# Patient Record
Sex: Female | Born: 1980 | Race: White | Marital: Married | Smoking: Never smoker
Health system: Southern US, Community
[De-identification: ages and names within clinical notes are randomized; demographics above are authoritative.]

## PROBLEM LIST (undated history)

## (undated) HISTORY — PX: WISDOM TOOTH EXTRACTION: SHX21

---

## 2017-06-03 ENCOUNTER — Ambulatory Visit: Payer: Self-pay | Admitting: Family Medicine

## 2018-01-28 ENCOUNTER — Other Ambulatory Visit: Payer: Self-pay | Admitting: Orthopedic Surgery

## 2018-01-28 DIAGNOSIS — S60222A Contusion of left hand, initial encounter: Secondary | ICD-10-CM

## 2018-02-08 ENCOUNTER — Ambulatory Visit
Admission: RE | Admit: 2018-02-08 | Discharge: 2018-02-08 | Disposition: A | Discharge status: Home / Self Care | Payer: Managed Care, Other (non HMO) | Source: Ambulatory Visit | Attending: Orthopedic Surgery | Admitting: Orthopedic Surgery

## 2018-02-08 DIAGNOSIS — S60222A Contusion of left hand, initial encounter: Secondary | ICD-10-CM

## 2018-02-08 MED ORDER — IOPAMIDOL (ISOVUE-M 200) INJECTION 41%
1.0000 mL | Freq: Once | INTRAMUSCULAR | Status: AC
  Administered 2018-02-08: 1 mL via INTRA_ARTICULAR

## 2018-02-16 ENCOUNTER — Other Ambulatory Visit: Payer: Self-pay | Admitting: Orthopedic Surgery

## 2018-02-22 ENCOUNTER — Other Ambulatory Visit: Payer: Self-pay

## 2018-02-22 ENCOUNTER — Encounter (HOSPITAL_BASED_OUTPATIENT_CLINIC_OR_DEPARTMENT_OTHER): Payer: Self-pay | Admitting: *Deleted

## 2018-03-01 ENCOUNTER — Ambulatory Visit (HOSPITAL_BASED_OUTPATIENT_CLINIC_OR_DEPARTMENT_OTHER)
Admission: RE | Admit: 2018-03-01 | Discharge: 2018-03-01 | Disposition: A | Discharge status: Home / Self Care | Payer: Managed Care, Other (non HMO) | Source: Ambulatory Visit | Attending: Orthopedic Surgery | Admitting: Orthopedic Surgery

## 2018-03-01 ENCOUNTER — Ambulatory Visit (HOSPITAL_BASED_OUTPATIENT_CLINIC_OR_DEPARTMENT_OTHER): Payer: Managed Care, Other (non HMO) | Admitting: Anesthesiology

## 2018-03-01 ENCOUNTER — Encounter (HOSPITAL_BASED_OUTPATIENT_CLINIC_OR_DEPARTMENT_OTHER)
Admission: RE | Disposition: A | Discharge status: Home / Self Care | Payer: Self-pay | Source: Ambulatory Visit | Attending: Orthopedic Surgery

## 2018-03-01 ENCOUNTER — Encounter (HOSPITAL_BASED_OUTPATIENT_CLINIC_OR_DEPARTMENT_OTHER): Payer: Self-pay | Admitting: Certified Registered"

## 2018-03-01 ENCOUNTER — Other Ambulatory Visit: Payer: Self-pay

## 2018-03-01 DIAGNOSIS — W010XXA Fall on same level from slipping, tripping and stumbling without subsequent striking against object, initial encounter: Secondary | ICD-10-CM | POA: Diagnosis not present

## 2018-03-01 DIAGNOSIS — S63418A Traumatic rupture of collateral ligament of other finger at metacarpophalangeal and interphalangeal joint, initial encounter: Secondary | ICD-10-CM | POA: Diagnosis present

## 2018-03-01 DIAGNOSIS — Y93K1 Activity, walking an animal: Secondary | ICD-10-CM | POA: Insufficient documentation

## 2018-03-01 DIAGNOSIS — Y9289 Other specified places as the place of occurrence of the external cause: Secondary | ICD-10-CM | POA: Insufficient documentation

## 2018-03-01 HISTORY — PX: ULNAR COLLATERAL LIGAMENT REPAIR: SHX6159

## 2018-03-01 LAB — POCT PREGNANCY, URINE: Preg Test, Ur: NEGATIVE

## 2018-03-01 SURGERY — REPAIR, LIGAMENT, ULNAR COLLATERAL
Anesthesia: Monitor Anesthesia Care | Site: Thumb | Laterality: Left

## 2018-03-01 MED ORDER — CEFAZOLIN SODIUM-DEXTROSE 2-4 GM/100ML-% IV SOLN
2.0000 g | INTRAVENOUS | Status: AC
  Administered 2018-03-01: 2 g via INTRAVENOUS

## 2018-03-01 MED ORDER — LACTATED RINGERS IV SOLN
INTRAVENOUS | Status: DC
  Administered 2018-03-01: 12:00:00 via INTRAVENOUS

## 2018-03-01 MED ORDER — CEFAZOLIN SODIUM-DEXTROSE 2-4 GM/100ML-% IV SOLN
INTRAVENOUS | Status: AC
  Filled 2018-03-01: qty 100

## 2018-03-01 MED ORDER — MIDAZOLAM HCL 2 MG/2ML IJ SOLN
INTRAMUSCULAR | Status: AC
  Filled 2018-03-01: qty 2

## 2018-03-01 MED ORDER — PROPOFOL 500 MG/50ML IV EMUL
INTRAVENOUS | Status: DC | PRN
  Administered 2018-03-01: 100 ug/kg/min via INTRAVENOUS

## 2018-03-01 MED ORDER — BUPIVACAINE-EPINEPHRINE (PF) 0.5% -1:200000 IJ SOLN
INTRAMUSCULAR | Status: DC | PRN
  Administered 2018-03-01: 30 mL via PERINEURAL

## 2018-03-01 MED ORDER — HYDROCODONE-ACETAMINOPHEN 5-325 MG PO TABS: 1.0000 | ORAL_TABLET | Freq: Four times a day (QID) | ORAL | 0 refills | Status: AC | PRN

## 2018-03-01 MED ORDER — ONDANSETRON HCL 4 MG/2ML IJ SOLN
INTRAMUSCULAR | Status: DC | PRN
  Administered 2018-03-01: 4 mg via INTRAVENOUS

## 2018-03-01 MED ORDER — FENTANYL CITRATE (PF) 100 MCG/2ML IJ SOLN
INTRAMUSCULAR | Status: AC
  Filled 2018-03-01: qty 2

## 2018-03-01 MED ORDER — SCOPOLAMINE 1 MG/3DAYS TD PT72: 1.0000 | MEDICATED_PATCH | Freq: Once | TRANSDERMAL | Status: DC | PRN

## 2018-03-01 MED ORDER — LIDOCAINE HCL (CARDIAC) PF 100 MG/5ML IV SOSY
PREFILLED_SYRINGE | INTRAVENOUS | Status: DC | PRN
  Administered 2018-03-01: 30 mg via INTRAVENOUS

## 2018-03-01 MED ORDER — ONDANSETRON HCL 4 MG/2ML IJ SOLN: 4.0000 mg | Freq: Four times a day (QID) | INTRAMUSCULAR | Status: DC | PRN

## 2018-03-01 MED ORDER — FENTANYL CITRATE (PF) 100 MCG/2ML IJ SOLN
50.0000 ug | INTRAMUSCULAR | Status: DC | PRN
  Administered 2018-03-01: 100 ug via INTRAVENOUS

## 2018-03-01 MED ORDER — OXYCODONE HCL 5 MG/5ML PO SOLN: 5.0000 mg | Freq: Once | ORAL | Status: DC | PRN

## 2018-03-01 MED ORDER — FENTANYL CITRATE (PF) 100 MCG/2ML IJ SOLN: 25.0000 ug | INTRAMUSCULAR | Status: DC | PRN

## 2018-03-01 MED ORDER — MIDAZOLAM HCL 2 MG/2ML IJ SOLN
1.0000 mg | INTRAMUSCULAR | Status: DC | PRN
  Administered 2018-03-01: 2 mg via INTRAVENOUS

## 2018-03-01 MED ORDER — OXYCODONE HCL 5 MG PO TABS: 5.0000 mg | ORAL_TABLET | Freq: Once | ORAL | Status: DC | PRN

## 2018-03-01 SURGICAL SUPPLY — 61 items
ANCHOR JUGGERKNOT 1.0 1DR 2-0 (Anchor) ×3 IMPLANT
BAG DECANTER FOR FLEXI CONT (MISCELLANEOUS) IMPLANT
BLADE MINI RND TIP GREEN BEAV (BLADE) IMPLANT
BLADE SURG 15 STRL LF DISP TIS (BLADE) ×1 IMPLANT
BLADE SURG 15 STRL SS (BLADE) ×2
BNDG COHESIVE 3X5 TAN STRL LF (GAUZE/BANDAGES/DRESSINGS) ×3 IMPLANT
BNDG ESMARK 4X9 LF (GAUZE/BANDAGES/DRESSINGS) IMPLANT
BNDG GAUZE ELAST 4 BULKY (GAUZE/BANDAGES/DRESSINGS) ×3 IMPLANT
CHLORAPREP W/TINT 26ML (MISCELLANEOUS) ×3 IMPLANT
CORD BIPOLAR FORCEPS 12FT (ELECTRODE) ×3 IMPLANT
COVER BACK TABLE 60X90IN (DRAPES) ×3 IMPLANT
COVER MAYO STAND STRL (DRAPES) ×3 IMPLANT
CUFF TOURNIQUET SINGLE 18IN (TOURNIQUET CUFF) IMPLANT
DECANTER SPIKE VIAL GLASS SM (MISCELLANEOUS) ×3 IMPLANT
DRAPE EXTREMITY T 121X128X90 (DRAPE) ×3 IMPLANT
DRAPE OEC MINIVIEW 54X84 (DRAPES) IMPLANT
DRAPE SURG 17X23 STRL (DRAPES) ×3 IMPLANT
GAUZE SPONGE 4X4 12PLY STRL (GAUZE/BANDAGES/DRESSINGS) ×3 IMPLANT
GAUZE XEROFORM 1X8 LF (GAUZE/BANDAGES/DRESSINGS) ×3 IMPLANT
GLOVE BIOGEL PI IND STRL 8.5 (GLOVE) ×1 IMPLANT
GLOVE BIOGEL PI INDICATOR 8.5 (GLOVE) ×2
GLOVE SURG ORTHO 8.0 STRL STRW (GLOVE) ×3 IMPLANT
GOWN STRL REUS W/ TWL LRG LVL3 (GOWN DISPOSABLE) ×1 IMPLANT
GOWN STRL REUS W/TWL LRG LVL3 (GOWN DISPOSABLE) ×2
GOWN STRL REUS W/TWL XL LVL3 (GOWN DISPOSABLE) ×3 IMPLANT
K-WIRE .035X4 (WIRE) IMPLANT
NDL SAFETY ECLIPSE 18X1.5 (NEEDLE) IMPLANT
NEEDLE FISTULA 1/2 CIRCLE (NEEDLE) IMPLANT
NEEDLE HYPO 18GX1.5 SHARP (NEEDLE)
NEEDLE KEITH (NEEDLE) IMPLANT
NEEDLE KEITH SZ10 STRAIGHT (NEEDLE) IMPLANT
NEEDLE PRECISIONGLIDE 27X1.5 (NEEDLE) IMPLANT
NS IRRIG 1000ML POUR BTL (IV SOLUTION) ×3 IMPLANT
PACK BASIN DAY SURGERY FS (CUSTOM PROCEDURE TRAY) ×3 IMPLANT
PAD CAST 3X4 CTTN HI CHSV (CAST SUPPLIES) ×1 IMPLANT
PAD CAST 4YDX4 CTTN HI CHSV (CAST SUPPLIES) IMPLANT
PADDING CAST ABS 4INX4YD NS (CAST SUPPLIES) ×2
PADDING CAST ABS COTTON 4X4 ST (CAST SUPPLIES) ×1 IMPLANT
PADDING CAST COTTON 3X4 STRL (CAST SUPPLIES) ×2
PADDING CAST COTTON 4X4 STRL (CAST SUPPLIES)
PASSER SUT SWANSON 36MM LOOP (INSTRUMENTS) IMPLANT
SLEEVE SCD COMPRESS KNEE MED (MISCELLANEOUS) IMPLANT
SPLINT PLASTER CAST XFAST 3X15 (CAST SUPPLIES) IMPLANT
SPLINT PLASTER XTRA FASTSET 3X (CAST SUPPLIES)
STOCKINETTE 4X48 STRL (DRAPES) ×3 IMPLANT
SUT ETHIBOND 3-0 V-5 (SUTURE) IMPLANT
SUT ETHILON 4 0 PS 2 18 (SUTURE) ×3 IMPLANT
SUT FIBERWIRE 2-0 18 17.9 3/8 (SUTURE)
SUT FIBERWIRE 3-0 18 TAPR NDL (SUTURE)
SUT MERSILENE 2.0 SH NDLE (SUTURE) IMPLANT
SUT MERSILENE 4 0 P 3 (SUTURE) IMPLANT
SUT SILK 4 0 PS 2 (SUTURE) IMPLANT
SUT STEEL 3 0 (SUTURE) IMPLANT
SUT STEEL 4 0 (SUTURE) IMPLANT
SUT VICRYL 4-0 PS2 18IN ABS (SUTURE) IMPLANT
SUTURE FIBERWR 2-0 18 17.9 3/8 (SUTURE) IMPLANT
SUTURE FIBERWR 3-0 18 TAPR NDL (SUTURE) IMPLANT
SYR BULB 3OZ (MISCELLANEOUS) ×3 IMPLANT
SYR CONTROL 10ML LL (SYRINGE) IMPLANT
TOWEL GREEN STERILE FF (TOWEL DISPOSABLE) ×6 IMPLANT
UNDERPAD 30X30 (UNDERPADS AND DIAPERS) ×3 IMPLANT

## 2018-03-01 NOTE — Anesthesia Procedure Notes (Signed)
Anesthesia Regional Block: Supraclavicular block   Pre-Anesthetic Checklist: ,, timeout performed, Correct Patient, Correct Site, Correct Laterality, Correct Procedure, Correct Position, site marked, Risks and benefits discussed,  Surgical consent,  Pre-op evaluation,  At surgeon's request and post-op pain management  Laterality: Left  Prep: chloraprep       Needles:  Injection technique: Single-shot  Needle Type: Echogenic Stimulator Needle     Needle Length: 5cm  Needle Gauge: 22     Additional Needles:   Procedures:, nerve stimulator,,,,,,,   Nerve Stimulator or Paresthesia:  Response: biceps flexion, 0.45 mA,   Additional Responses:   Narrative:  Start time: 03/01/2018 12:40 PM End time: 03/01/2018 12:48 PM Injection made incrementally with aspirations every 5 mL.  Performed by: Personally  Anesthesiologist: Achille RichHodierne, Kiet Geer, MD  Additional Notes: Functioning IV was confirmed and monitors were applied.  A 50mm 22ga Arrow echogenic stimulator needle was used. Sterile prep and drape,hand hygiene and sterile gloves were used.  Negative aspiration and negative test dose prior to incremental administration of local anesthetic. The patient tolerated the procedure well.  Ultrasound guidance: relevent anatomy identified, needle position confirmed, local anesthetic spread visualized around nerve(s), vascular puncture avoided.  Image printed for medical record.

## 2018-03-01 NOTE — Anesthesia Preprocedure Evaluation (Signed)
Anesthesia Evaluation  Patient identified by MRN, date of birth, ID band Patient awake    Reviewed: Allergy & Precautions, H&P , NPO status , Patient's Chart, lab work & pertinent test results  Airway Mallampati: II   Neck ROM: full    Dental   Pulmonary neg pulmonary ROS,    breath sounds clear to auscultation       Cardiovascular negative cardio ROS   Rhythm:regular Rate:Normal     Neuro/Psych    GI/Hepatic   Endo/Other    Renal/GU      Musculoskeletal   Abdominal   Peds  Hematology   Anesthesia Other Findings   Reproductive/Obstetrics                             Anesthesia Physical Anesthesia Plan  ASA: I  Anesthesia Plan: MAC and Regional   Post-op Pain Management:    Induction: Intravenous  PONV Risk Score and Plan: 2 and Ondansetron, Propofol infusion, Dexamethasone, Midazolam and Treatment may vary due to age or medical condition  Airway Management Planned: Simple Face Mask  Additional Equipment:   Intra-op Plan:   Post-operative Plan:   Informed Consent: I have reviewed the patients History and Physical, chart, labs and discussed the procedure including the risks, benefits and alternatives for the proposed anesthesia with the patient or authorized representative who has indicated his/her understanding and acceptance.     Plan Discussed with: CRNA, Anesthesiologist and Surgeon  Anesthesia Plan Comments:         Anesthesia Quick Evaluation

## 2018-03-01 NOTE — Anesthesia Procedure Notes (Signed)
Procedure Name: MAC Date/Time: 03/01/2018 1:37 PM Performed by: Signe Colt, CRNA Pre-anesthesia Checklist: Patient identified, Emergency Drugs available, Suction available, Patient being monitored and Timeout performed Patient Re-evaluated:Patient Re-evaluated prior to induction Oxygen Delivery Method: Simple face mask

## 2018-03-01 NOTE — Discharge Instructions (Addendum)
[facility] Instructions °Hand Surgery ° °Wound Care: °Keep your hand elevated above the level of your heart.  Do not allow it to dangle by your side.  Keep the dressing dry and do not remove it unless your doctor advises you to do so.  He will usually change it at the time of your post-op visit.  Moving your fingers is advised to stimulate circulation but will depend on the site of your surgery.  If you have a splint applied, your doctor will advise you regarding movement. ° °Activity: °Do not drive or operate machinery today.  Rest today and then you may return to your normal activity and work as indicated by your physician. ° °Diet:  °Drink liquids today or eat a light diet.  You may resume a regular diet tomorrow.   ° ° °Post Anesthesia Home Care Instructions ° °Activity: °Get plenty of rest for the remainder of the day. A responsible individual must stay with you for 24 hours following the procedure.  °For the next 24 hours, DO NOT: °-Drive a car °-Operate machinery °-Drink alcoholic beverages °-Take any medication unless instructed by your physician °-Make any legal decisions or sign important papers. ° °Meals: °Start with liquid foods such as gelatin or soup. Progress to regular foods as tolerated. Avoid greasy, spicy, heavy foods. If nausea and/or vomiting occur, drink only clear liquids until the nausea and/or vomiting subsides. Call your physician if vomiting continues. ° °Special Instructions/Symptoms: °Your throat may feel dry or sore from the anesthesia or the breathing tube placed in your throat during surgery. If this causes discomfort, gargle with warm salt water. The discomfort should disappear within 24 hours. ° °If you had a scopolamine patch placed behind your ear for the management of post- operative nausea and/or vomiting: ° °1. The medication in the patch is effective for 72 hours, after which it should be removed.  Wrap patch in a tissue and discard in the trash. Wash hands thoroughly with  soap and water. °2. You may remove the patch earlier than 72 hours if you experience unpleasant side effects which may include dry mouth, dizziness or visual disturbances. °3. Avoid touching the patch. Wash your hands with soap and water after contact with the patch. °    ° °General expectations: °Pain for two to three days. °Fingers may become slightly swollen. ° °Call your doctor if any of the following occur: °Severe pain not relieved by pain medication. °Elevated temperature. °Dressing soaked with blood. °Inability to move fingers. °White or bluish color to fingers.  °

## 2018-03-01 NOTE — Progress Notes (Signed)
Assisted Dr. Hodierne with left, ultrasound guided, supraclavicular block. Side rails up, monitors on throughout procedure. See vital signs in flow sheet. Tolerated Procedure well. 

## 2018-03-01 NOTE — H&P (Signed)
Penny BlonderHeather Ortega is an 37 y.o. female.   Chief Complaint:l eft  thumb pain OZH:[id number]:Penny Ortega is a 37 year old right-hand-dominant female who sustained an injury to her left thumb [place] 2 months ago. She was walking her dog with a leash the dog bolted and she jammed her thumb into a bed she does not remember the exact mechanism of injury she complains of pain on the volar radial aspect of the metacarpal phalangeal joint. She was seen at an urgent care there. They felt she had a sprain placed her in a thumb spica splint and referred her for continued care she has no prior history of injury. She complains of a sharp pain with a VAS score 3-4 over 10 if she uses her thumb. She has taken ibuprofen only for discomfort. She states the splint has been adequate but is large.She was referred for arthrographic MRI of her metacarpal phalangeal joint left thumb.She has had her MRI done revealing a tear of the ulnar collateral ligament metacarpal phalangeal joint left thumb. This is read by Dr. Allena KatzPatel. There is probably partial healing. Collateral ligament appears to lie in reasonable position along the side of the thumb middle phalanx but no healing. She has no history of diabetes thyroid problems arthritis or gout. Family history is negative for each of these also appear     History reviewed. No pertinent past medical history.  Past Surgical History:  Procedure Laterality Date  . WISDOM TOOTH EXTRACTION      History reviewed. No pertinent family history. Social History:  reports that she has never smoked. She has never used smokeless tobacco. She reports that she drinks alcohol. She reports that she does not use drugs.  Allergies: No Known Allergies  No medications prior to admission.    No results found for this or any previous visit (from the past 48 hour(s)).  No results found.   Pertinent items are noted in HPI.  Height 5\' 5"  (1.651 m),  weight 65.8 kg (145 lb), last menstrual period 02/07/2018.  General appearance: alert, cooperative and appears stated age Head: Normocephalic, without obvious abnormality Neck: no JVD Resp: clear to auscultation bilaterally Cardio: regular rate and rhythm, S1, S2 normal, no murmur, click, rub or gallop GI: soft, non-tender; bowel sounds normal; no masses,  no organomegaly Extremities: pain MCP left thumb Pulses: 2+ and symmetric Skin: Skin color, texture, turgor normal. No rashes or lesions Neurologic: Grossly normal Incision/Wound: na  Assessment/Plan Assessment:  1. Gamekeeper's thumb of left hand   Plan: We have discussed with her the possibility of further conservative treatment versus open repair. She states that she is tired of this and she does not want to consider new conservative treatment. She would like to have this operated on. Pre-peri-and postoperative course are discussed along with risks and complications. She is aware there is no guarantee that surgery the possibility of infection recurrence injury to arteries nerves tendons complete relief of symptoms dystrophy. She is scheduled for repair reconstruction ulnar collateral ligament metacarpal phalangeal joint left thumb with possible APL graft as an outpatient under regional anesthesia.      Keita Demarco R 03/01/2018, 10:43 AM

## 2018-03-01 NOTE — Op Note (Signed)
NAME: Penny Ortega MEDICAL RECORD NO: [id number] DATE OF BIRTH: 10/01/1980 [facility] LOCATION: [facility] PHYSICIAN: Nicki ReaperGARY R. Aicha Clingenpeel, MD   OPERATIVE REPORT   DATE OF PROCEDURE: 03/01/18    PREOPERATIVE DIAGNOSIS:   Gamekeeper's thumb left thumb   POSTOPERATIVE DIAGNOSIS:   Same   PROCEDURE:   Repair ulnar collateral ligament metacarpal phalangeal joint left thumb   SURGEON: Cindee SaltGary Esaiah Wanless, M.D.   ASSISTANT: Myrtice LauthK Dung Prien,MD   ANESTHESIA:  Regional with sedation   INTRAVENOUS FLUIDS:  Per anesthesia flow sheet.   ESTIMATED BLOOD LOSS:  Minimal.   COMPLICATIONS:  None.   SPECIMENS:  none   TOURNIQUET TIME:    Total Tourniquet Time Documented: Upper Arm (Left) - 31 minutes Total: Upper Arm (Left) - 31 minutes    DISPOSITION:  Stable to PACU.   INDICATIONS: Patient is a 37 year old female who sustained an injury to her left thumb and a fall.  She does not recall the exact injury.  This approximately 2-1/2 months old.  She has undergone conservative treatment and continues to have pain.  She has had an MRI done revealing partial healing of a ulnar collateral ligament metacarpal phalangeal joint of the left thumb with extrusion of the contrast agent through the unhealed portion.  She is aware there that there is no guarantee to the surgery the possibility of infection recurrence injury to arteries and nerves tendons and complete relief symptoms dystrophy.  Pre-peri-and postoperative course been discussed along with risks and complications.  In the preoperative area the patient seen extremity marked by both patient and surgeon antibiotic given OPERATIVE COURSE: Patient is brought to the operating room following a supraclavicular block in the preoperative area under the direction the anesthesia department.  She was prepped and draped in supine position with a left arm free.  IV sedation was given a 3-minute dry time allowed and timeout taken for patient procedure.   The limb was exsanguinated with an Esmarch bandage turn placed on the arm was inflated to 250 mmHg.  A curvilinear incision was made over the metacarpal phalangeal joint of the left thumb carried down through subcutaneous tissue.  No dorsal sensory nerve was identified protected.  The abductor aponeurosis was then incised with sharp dissection removing the metacarpal phalangeal joint and incision was made on the dorsal capsule.  Blood immediately extruded.  The joint was open the collateral ligament was found to be partially healed but a large area was entirely unhealed to the bone.  The small portion which had healed was  released from the proximal phalanx.  The area was roughened and the area irrigated a drill hole was placed for a mini juggernaut.  It was confirmed with image intensification both AP and lateral direction revealing good position for repair of the collateral ligament.  The anchor was placed the collateral ligament was then repaired doubling it over distally with a suture to maintain it up against the bone.  This was quite stable.  The abductor aponeurosis was then repaired with a running 5-0 Mersilene suture.  The skin was closed with interrupted 5-0 nylon sutures.  A sterile compressive dressing dorsal palmar thumb spica splint was applied.  Inflation of the tourniquet all fingers immediately pink.  She was taken to the recovery room for observation in satisfactory condition.  She will be [place] home to [place] [place] 1 week on Norco.  She will try ibuprofen Tylenol to see if this is adequate if not she will have  the Norco for breakthrough.   Nicki Reaper, MD Electronically signed, 03/01/18

## 2018-03-01 NOTE — Op Note (Signed)
I assisted Surgeon(s) and Role:    * Cindee SaltKuzma, Gary, MD - Primary    * Betha LoaKuzma, Conlee Sliter, MD - Assisting on the Procedure(s): REPAIR RECONSTRUCTION ULNAR COLLATERAL LIGAMENT LEFT THUMB on 03/01/2018.  I provided assistance on this case as follows: retraction soft tissues, preparation site for anchor, positioning of finger for anchor placement.  Electronically signed by: Tami RibasKUZMA,Kerri Asche R, MD Date: 03/01/2018 Time: 7:29 PM

## 2018-03-01 NOTE — Transfer of Care (Signed)
Immediate Anesthesia Transfer of Care Note  Patient: Penny Ortega  Procedure(s) Performed: REPAIR RECONSTRUCTION ULNAR COLLATERAL LIGAMENT LEFT THUMB (Left Thumb)  Patient Location: PACU  Anesthesia Type:MAC combined with regional for post-op pain  Level of Consciousness: awake, alert , oriented and patient cooperative  Airway & Oxygen Therapy: Patient Spontanous Breathing and Patient connected to face mask oxygen  Post-op Assessment: Report given to RN and Post -op Vital signs reviewed and stable  Post vital signs: Reviewed and stable  Last Vitals:  Vitals Value Taken Time  BP    Temp    Pulse 55 03/01/2018  2:12 PM  Resp 13 03/01/2018  2:12 PM  SpO2 100 % 03/01/2018  2:12 PM  Vitals shown include unvalidated device data.  Last Pain:  Vitals:   03/01/18 1222  TempSrc: Oral         Complications: No apparent anesthesia complications

## 2018-03-01 NOTE — Brief Op Note (Signed)
03/01/2018  2:10 PM  PATIENT:  Penny Ortega  37 y.o. female  PRE-OPERATIVE DIAGNOSIS:  Game Keeper's Thumb Left  POST-OPERATIVE DIAGNOSIS:  Game Keeper's Thumb Left  PROCEDURE:  Procedure(s): REPAIR RECONSTRUCTION ULNAR COLLATERAL LIGAMENT LEFT THUMB (Left)  SURGEON:  Surgeon(s) and Role:    * Cindee SaltKuzma, Joram Venson, MD - Primary    * Betha LoaKuzma, Kevin, MD - Assisting  PHYSICIAN ASSISTANT:   ASSISTANTS: K Larson Limones,MD   ANESTHESIA:   regional and IV sedation  EBL:  1ml  BLOOD ADMINISTERED:none  DRAINS: none   LOCAL MEDICATIONS USED:  NONE  SPECIMEN:  No Specimen  DISPOSITION OF SPECIMEN:  N/A  COUNTS:  YES  TOURNIQUET:   Total Tourniquet Time Documented: Upper Arm (Left) - 31 minutes Total: Upper Arm (Left) - 31 minutes   DICTATION: .Reubin Milanragon Dictation  PLAN OF CARE: Discharge to home after PACU  PATIENT DISPOSITION:  PACU - hemodynamically stable.

## 2018-03-02 ENCOUNTER — Encounter (HOSPITAL_BASED_OUTPATIENT_CLINIC_OR_DEPARTMENT_OTHER): Payer: Self-pay | Admitting: Orthopedic Surgery

## 2018-03-02 NOTE — Anesthesia Postprocedure Evaluation (Signed)
Anesthesia Post Note  Patient: Penny Ortega  Procedure(s) Performed: REPAIR RECONSTRUCTION ULNAR COLLATERAL LIGAMENT LEFT THUMB (Left Thumb)     Patient location during evaluation: PACU Anesthesia Type: Regional and MAC Level of consciousness: awake and alert Pain management: pain level controlled Vital Signs Assessment: post-procedure vital signs reviewed and stable Respiratory status: spontaneous breathing, nonlabored ventilation, respiratory function stable and patient connected to nasal cannula oxygen Cardiovascular status: stable and blood pressure returned to baseline Postop Assessment: no apparent nausea or vomiting Anesthetic complications: no    Last Vitals:  Vitals:   03/01/18 1445 03/01/18 1458  BP: 108/74 117/80  Pulse: 60 (!) 54  Resp: 20 18  Temp:  36.6 C  SpO2: 98% 99%    Last Pain:  Vitals:   03/01/18 1458  TempSrc: Oral  PainSc: 0-No pain   Pain Goal:                 Taura Lamarre S

## 2018-08-31 ENCOUNTER — Other Ambulatory Visit: Payer: Self-pay | Admitting: Hand Surgery

## 2018-08-31 DIAGNOSIS — M79645 Pain in left finger(s): Secondary | ICD-10-CM

## 2018-09-07 ENCOUNTER — Ambulatory Visit
Admission: RE | Admit: 2018-09-07 | Discharge: 2018-09-07 | Disposition: A | Discharge status: Home / Self Care | Payer: Managed Care, Other (non HMO) | Source: Ambulatory Visit | Attending: Hand Surgery | Admitting: Hand Surgery

## 2018-09-07 DIAGNOSIS — M79645 Pain in left finger(s): Secondary | ICD-10-CM

## 2018-09-07 MED ORDER — GADOBENATE DIMEGLUMINE 529 MG/ML IV SOLN
14.0000 mL | Freq: Once | INTRAVENOUS | Status: AC | PRN
  Administered 2018-09-07: 14 mL via INTRAVENOUS

## 2018-09-18 ENCOUNTER — Emergency Department (HOSPITAL_COMMUNITY)
Admission: EM | Admit: 2018-09-18 | Discharge: 2018-09-18 | Discharge status: Against Medical Advice | Payer: Managed Care, Other (non HMO) | Attending: Emergency Medicine | Admitting: Emergency Medicine

## 2018-09-18 ENCOUNTER — Encounter (HOSPITAL_COMMUNITY): Payer: Self-pay | Admitting: *Deleted

## 2018-09-18 ENCOUNTER — Other Ambulatory Visit: Payer: Self-pay

## 2018-09-18 DIAGNOSIS — M25562 Pain in left knee: Secondary | ICD-10-CM | POA: Diagnosis not present

## 2018-09-18 DIAGNOSIS — M25561 Pain in right knee: Secondary | ICD-10-CM | POA: Insufficient documentation

## 2018-09-18 DIAGNOSIS — R52 Pain, unspecified: Secondary | ICD-10-CM

## 2018-09-18 MED ORDER — ACETAMINOPHEN 325 MG PO TABS
650.0000 mg | ORAL_TABLET | Freq: Once | ORAL | Status: DC
Start: 2018-09-18 — End: 2018-09-18

## 2018-09-18 MED ORDER — SODIUM CHLORIDE 0.9 % IV BOLUS: 1000.0000 mL | Freq: Once | INTRAVENOUS | Status: DC

## 2018-09-18 NOTE — ED Provider Notes (Signed)
[facility]-EMERGENCY DEPT Provider Note   CSN: [id number] Arrival date & time: 09/18/18  0422     History   Chief Complaint Chief Complaint  Patient presents with  . Knee Pain    bilat  . Weakness    lower body    HPI Penny Ortega is a 38 y.o. female who presents to the ED with complaints of generalized body aches that began this AM when she woke from sleep this AM. She woke from sleep with significant pain in her bilateral knees, she ambulated to the bathroom and felt generally weak with this and had 1 episode of emesis. She states the significant pain in her knees prompted ER visit, however now the pain in the knees has improved and is more generalized to her entire body. She states she feels like she has been bruised all over (no actual bruising just sensation) & generally weak. No specific alleviating/aggravating factors. No intervention PTA. Marland Kitchen Patient states she has felt poorly with congestion, rhinorrhea, and productive cough for 2-3 months. She states she has received Augmentin once without improvement as well as Azithromycin & prednisone which she completed 2 days prior without improvement. Has had negative CXRs. Other than these sxs she went to bed feeling otherwise at baseline. Of note her son was sick with influenza A last week. Denies fever, chills, chest pain, dyspnea, abdominal pain, or diarrhea. Denies dysuria or darkened urine. Denies trauma or heavy lifting.   HPI  History reviewed. No pertinent past medical history.  There are no active problems to display for this patient.   Past Surgical History:  Procedure Laterality Date  . ULNAR COLLATERAL LIGAMENT REPAIR Left 03/01/2018   Procedure: REPAIR RECONSTRUCTION ULNAR COLLATERAL LIGAMENT LEFT THUMB;  Surgeon: Cindee Salt, MD;  Location: [facility];  Service: Orthopedics;  Laterality: Left;  . WISDOM TOOTH EXTRACTION       OB History   No obstetric history on file.       Home Medications    Prior to Admission medications   Medication Sig Start Date End Date Taking? Authorizing Provider  HYDROcodone-acetaminophen (NORCO) 5-325 MG tablet Take 1 tablet by mouth every 6 (six) hours as needed. 03/01/18   Cindee Salt, MD  Ibuprofen (ADVIL PO) Take 800 mg by mouth.    [provider]    Family History No family history on file.  Social History Social History   Tobacco Use  . Smoking status: Never Smoker  . Smokeless tobacco: Never Used  Substance Use Topics  . Alcohol use: Yes    Frequency: Never    Comment: Weekends  . Drug use: Never     Allergies   Patient has no known allergies.   Review of Systems Review of Systems  Constitutional: Negative for chills and fever.  HENT: Positive for congestion and sore throat. Negative for ear pain.   Respiratory: Positive for cough. Negative for shortness of breath.   Cardiovascular: Negative for chest pain and leg swelling.  Gastrointestinal: Positive for vomiting (x1). Negative for abdominal pain, blood in stool, constipation and diarrhea.  Genitourinary: Negative for dysuria and hematuria.  Musculoskeletal: Positive for arthralgias and myalgias.  Neurological: Positive for weakness (generalized).  All other systems reviewed and are negative.    Physical Exam Updated Vital Signs BP 120/81 (BP Location: Right Arm)   Pulse 92   Temp 97.9 F (36.6 C) (Oral)   Resp 19   Ht 5\' 5"  (1.651 m)   Wt 63.5  kg   LMP 09/17/2018 (Approximate)   SpO2 100%   BMI 23.30 kg/m   Physical Exam Vitals signs and nursing note reviewed.  Constitutional:      General: She is not in acute distress.    Appearance: She is well-developed.  HENT:     Head: Normocephalic and atraumatic.     Right Ear: Tympanic membrane normal. Tympanic membrane is not perforated, erythematous, retracted or bulging.     Left Ear: Tympanic membrane normal. Tympanic membrane is not perforated, erythematous, retracted or  bulging.     Nose: Congestion present.     Comments: No sinus tenderness.     Mouth/Throat:     Pharynx: Uvula midline. No oropharyngeal exudate or posterior oropharyngeal erythema.     Comments: Posterior oropharynx is symmetric appearing. Patient tolerating own secretions without difficulty. No trismus. No drooling. No hot potato voice. No swelling beneath the tongue, submandibular compartment is soft.  Eyes:     General:        Right eye: No discharge.        Left eye: No discharge.     Conjunctiva/sclera: Conjunctivae normal.     Pupils: Pupils are equal, round, and reactive to light.  Neck:     Musculoskeletal: Normal range of motion and neck supple.  Cardiovascular:     Rate and Rhythm: Normal rate and regular rhythm.     Heart sounds: No murmur.  Pulmonary:     Effort: No respiratory distress.     Breath sounds: Normal breath sounds. No wheezing or rales.  Abdominal:     General: There is no distension.     Palpations: Abdomen is soft.     Tenderness: There is no abdominal tenderness.  Musculoskeletal: Normal range of motion.        General: No swelling, tenderness or deformity.     Right lower leg: No edema.     Left lower leg: No edema.     Comments: No obvious deformity, appreciable swelling, erythema, ecchymosis, warmth, or rashes.  UE/LEs: Normal AROM throughout without palpable bony tenderness. Compartments are soft.   Lymphadenopathy:     Cervical: No cervical adenopathy.  Skin:    General: Skin is warm and dry.     Capillary Refill: Capillary refill takes less than 2 seconds.     Findings: No rash.  Neurological:     General: No focal deficit present.     Mental Status: She is alert.     Comments: 5/5 strength with plantar/dorsiflexion bilaterally. Sensation grossly intact to bilateral lower extremities. Ambulatory.   Psychiatric:        Behavior: Behavior normal.      ED Treatments / Results  Labs (all labs ordered are listed, but only abnormal results  are displayed) Labs Reviewed - No data to display  EKG None  Radiology No results found.  Procedures Procedures (including critical care time)  Medications Ordered in ED Medications - No data to display   Initial Impression / Assessment and Plan / ED Course  I have reviewed the triage vital signs and the nursing notes.  Pertinent labs & imaging results that were available during my care of the patient were reviewed by me and considered in my medical decision making (see chart for details).   Patient presents to the ED with generalized body aches & generalized weakness, has had congestion/cough for past few months unrelieved by abx x 2, also 1 episode of emesis this evening. Of note recent sick  contact with son diagnosed with influenza A. Patient is nontoxic appearing, in no apparent distress, vitals WNL. Congestion not alleviated by abx, given duration and afebrile at this point feel acute bacterial sinusitis is unlikely. TMs clear. Centor 0- doubt strep. Lungs CTA, doubt pneumonia. Her initial knee pain is now generalized, knees with AROM intact, non tender, doubt septic joint, no trauma to suggest fracture/dislocation. No true focal weakness on exam or sensory deficits to suggest GBS. Suspect influenza w/ recent sick contacts, additional ddx: electrolyte disturbance, anemia, rhabdomyolysis, or other viral process. Evaluation with labs, fluids & tylenol for symptomatic relief.   NT informed me patient did not wish to remain in the ER for further evaluation & management. She relayed that if it was just the flu that she needed to go & care for her children. Unfortunately upon me being informed of this the patient had already left her exam room and I was unable to discuss further with her, nursing reportedly discussed risks/benefits and had patient sign AMA. Patient appeared nontoxic and had normal vitals throughout my assessment.   Final Clinical Impressions(s) / ED Diagnoses   Final  diagnoses:  Generalized body aches    ED Discharge Orders    None       Cherly Anderson, PA-C 09/18/18 [id number]    Ward, Layla Maw, DO 09/18/18 [phone]

## 2018-09-18 NOTE — ED Notes (Signed)
Pt stated "If I just have the flu, I need to go home.  I have 2 kids @ home, 3 & 5 and no one to watch them.  I'm just going to go."  Signature pad is currently not working.  Pt refused all treatments or testing.

## 2018-09-18 NOTE — ED Notes (Signed)
Pt stated her knees were feeling better and she felt like the only thing that was wrong was the flu. Writer asked pt to stay to at least speak with PA again and have tests done but pt denied. Pt stated she needed to get home to her kids to tend to them.  Pt directed out and asked to sign out AMA.  Lelon MastSamantha, PA made aware.

## 2018-09-18 NOTE — ED Triage Notes (Addendum)
Pt stated "I woke up with my knees hurting and my lower body feels weak.  It feels like my knees are pulling apart.  I just finished abx for a sinus infection x 2 days ago."  Pt denies trauma.

## 2018-09-18 NOTE — ED Notes (Signed)
Bed: WA06 Expected date:  Expected time:  Means of arrival:  Comments: 

## 2018-10-21 ENCOUNTER — Other Ambulatory Visit: Payer: Self-pay | Admitting: Orthopedic Surgery

## 2018-10-21 DIAGNOSIS — M79645 Pain in left finger(s): Secondary | ICD-10-CM

## 2018-10-26 ENCOUNTER — Other Ambulatory Visit: Payer: Managed Care, Other (non HMO)

## 2018-10-26 ENCOUNTER — Ambulatory Visit
Admission: RE | Admit: 2018-10-26 | Discharge: 2018-10-26 | Disposition: A | Discharge status: Home / Self Care | Payer: Self-pay | Source: Ambulatory Visit | Attending: Orthopedic Surgery | Admitting: Orthopedic Surgery

## 2018-10-26 DIAGNOSIS — M79645 Pain in left finger(s): Secondary | ICD-10-CM

## 2019-03-01 ENCOUNTER — Other Ambulatory Visit: Payer: Self-pay | Admitting: Obstetrics and Gynecology

## 2019-03-01 DIAGNOSIS — M810 Age-related osteoporosis without current pathological fracture: Secondary | ICD-10-CM

## 2019-03-01 DIAGNOSIS — R5381 Other malaise: Secondary | ICD-10-CM

## 2019-04-19 ENCOUNTER — Other Ambulatory Visit: Payer: Self-pay

## 2019-04-19 ENCOUNTER — Ambulatory Visit
Admission: RE | Admit: 2019-04-19 | Discharge: 2019-04-19 | Disposition: A | Discharge status: Home / Self Care | Payer: Managed Care, Other (non HMO) | Source: Ambulatory Visit | Attending: Obstetrics and Gynecology | Admitting: Obstetrics and Gynecology

## 2019-04-19 DIAGNOSIS — M810 Age-related osteoporosis without current pathological fracture: Secondary | ICD-10-CM

## 2021-02-25 IMAGING — CT CT OF THE LEFT HAND WITHOUT CONTRAST
4 of 6 series · 14 of 33 positions shown, 16 images · non-contrast
Comparison: MRI left hand dated September 07, 2018.

CLINICAL DATA: Left thumb and index finger injury last [REDACTED].
Prior thumb UCL repair last [REDACTED]. Persistent index finger pain.

EXAM:
CT OF THE LEFT HAND WITHOUT CONTRAST
TECHNIQUE: Multidetector CT imaging of the left hand was performed according to
the standard protocol. Multiplanar CT image reconstructions were
also generated.

[Series 3: wrist 1.50 br60 s3 ax axial bone hd fov · axial · 0.29mm/px · z∈[-635,-497]mm · 5 of 261 slices shown, 7 images]
[im 44/261  soft-tissue]
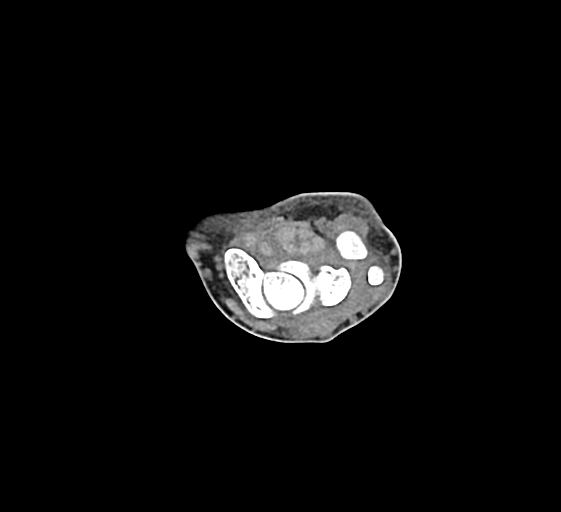
[im 44/261  bone]
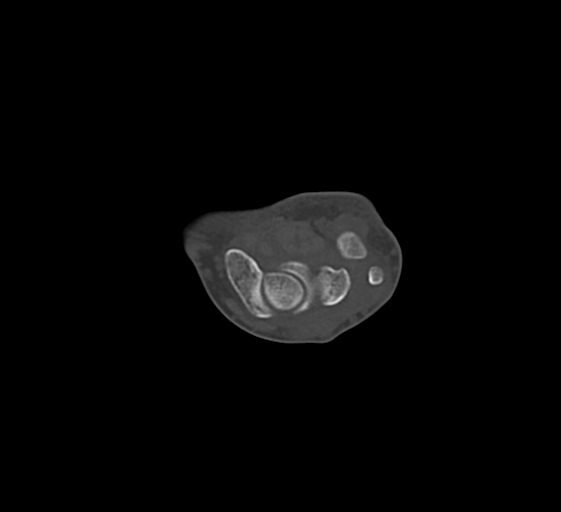
[im 87/261  bone]
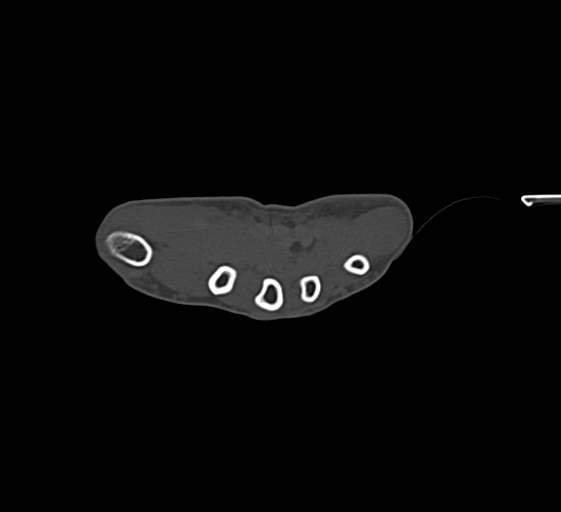
[im 131/261  bone]
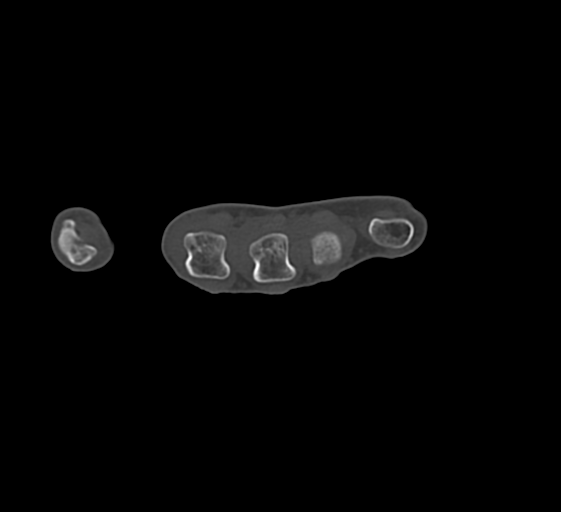
[im 174/261  bone]
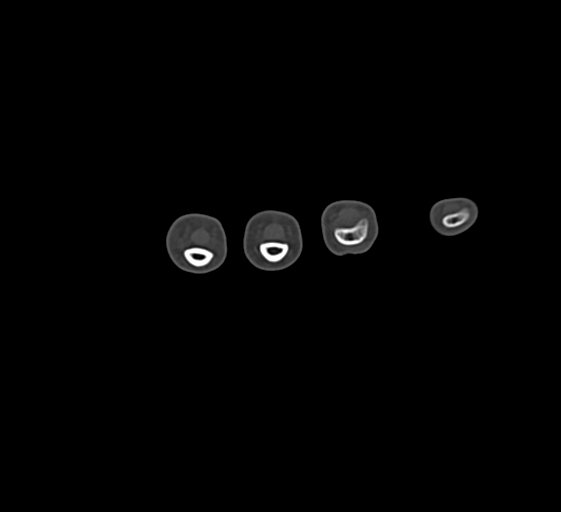
[im 217/261  soft-tissue]
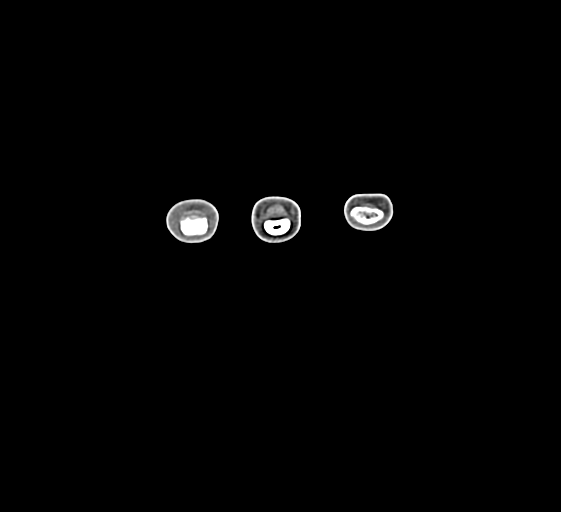
[im 217/261  bone]
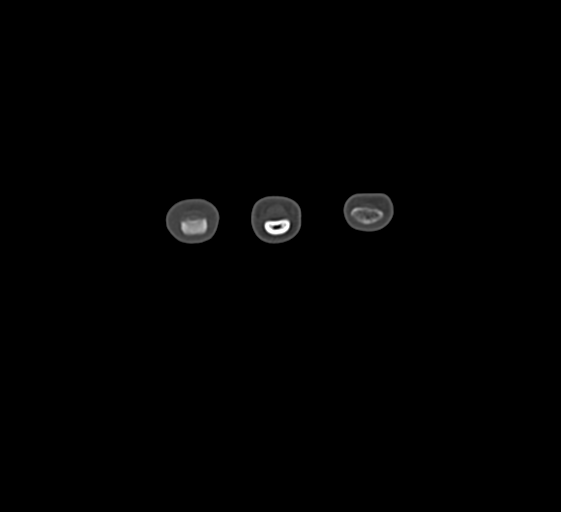

[Series 5: wrist 1.50 br40 s3 ax axial st hd fov · axial · 0.29mm/px · z∈[-638,-567]mm · 3 of 265 slices shown]
[im 45/265  bone]
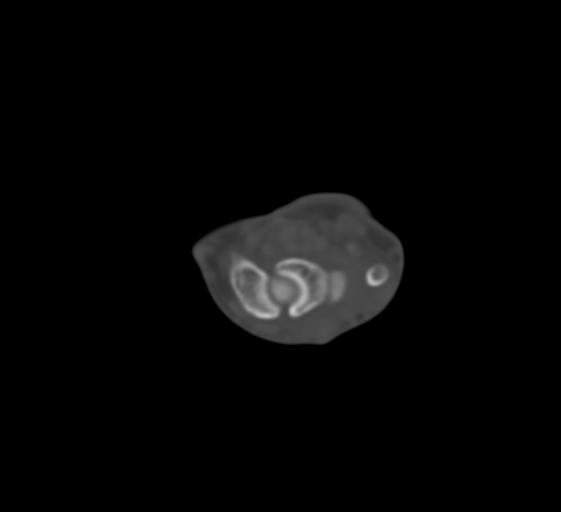
[im 89/265  bone]
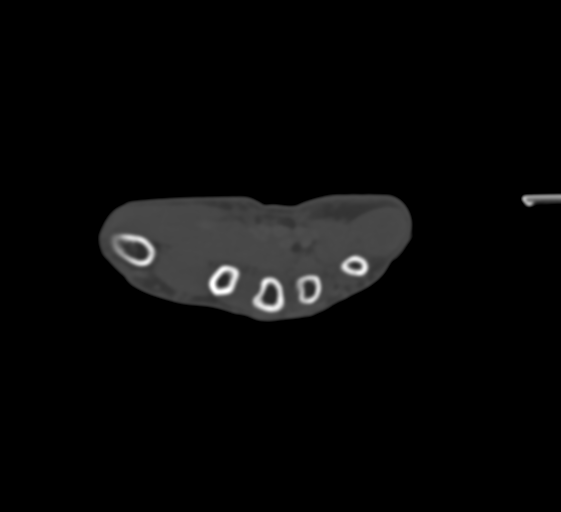
[im 133/265  bone]
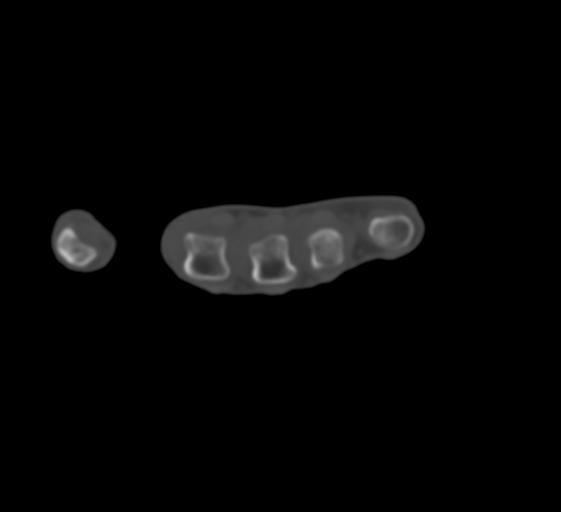

[Series 9: wrist 1.50 br60 s3 cor cor bone hd fov · coronal · 0.32mm/px · 1 of 83 slices shown]
[im 42/83  bone]
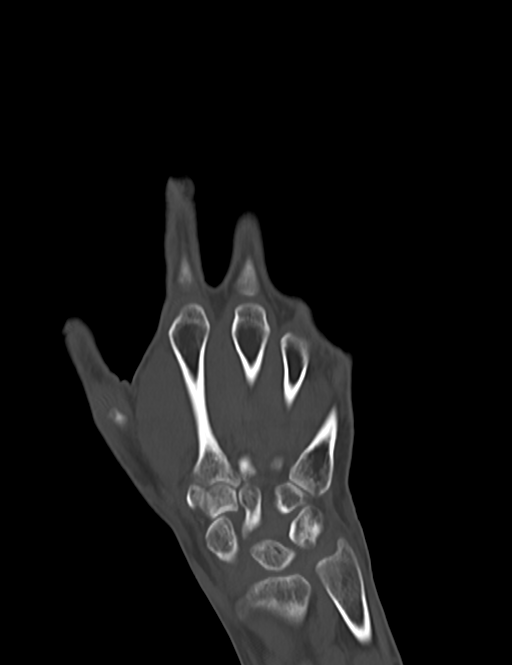

[Series 13: wrist 1.50 br60 s3 sag sag bone hd fov · sagittal · 0.29mm/px · 5 of 204 slices shown]
[im 34/204  bone]
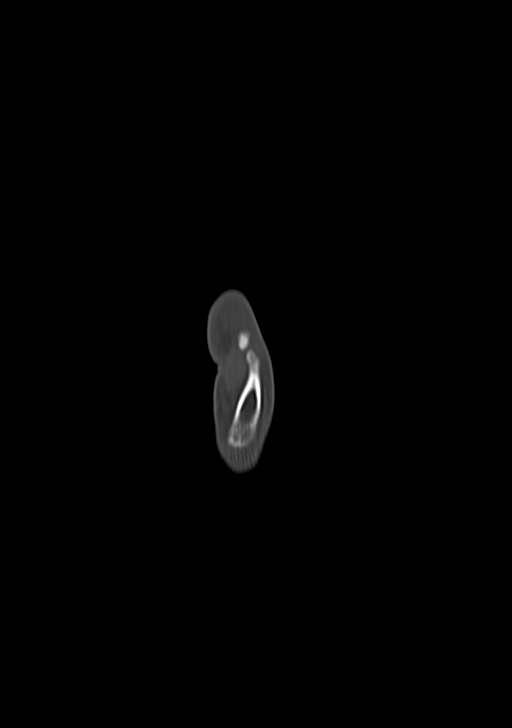
[im 68/204  bone]
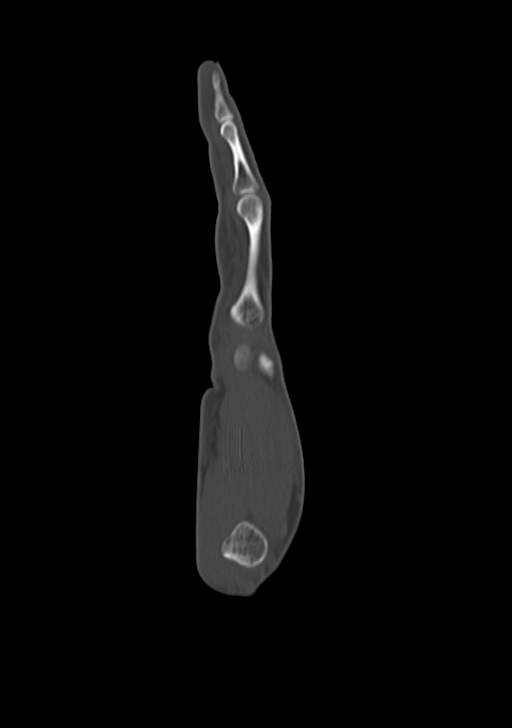
[im 102/204  bone]
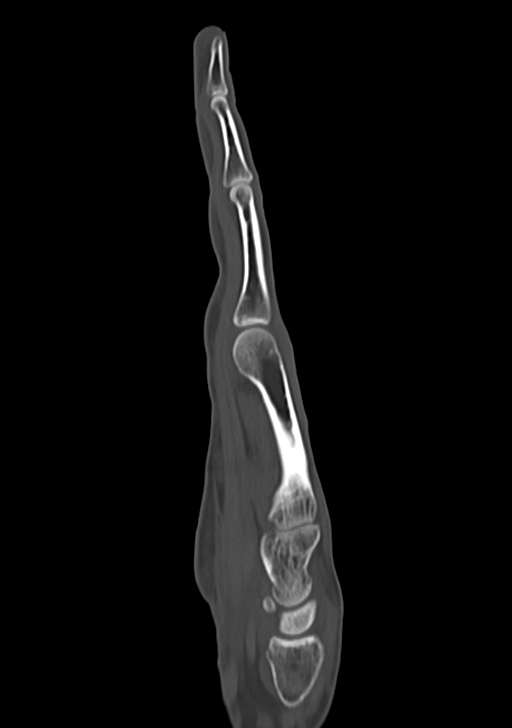
[im 136/204  bone]
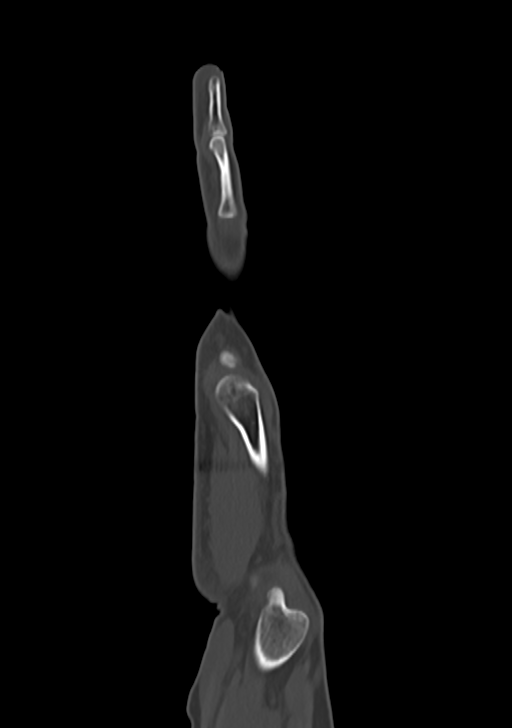
[im 170/204  bone]
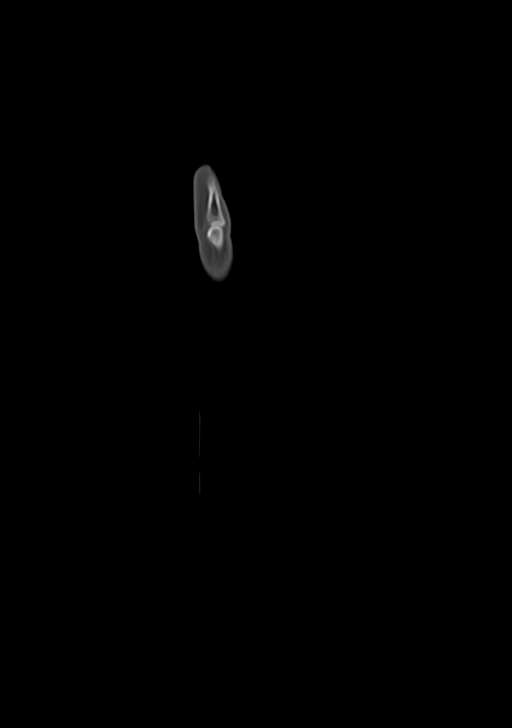

[14 of 33 positions shown; findings below may reference images not displayed]

FINDINGS: Bones/Joint/Cartilage

No acute fracture or dislocation. The second MCP joint sesamoid
fracture has healed. Small radiolucent suture anchor at the ulnar
base of the first proximal phalanx related to prior UCL repair.
Joint spaces are preserved. Bone mineralization is normal. No joint
effusion.

Ligaments

Suboptimally assessed by CT.

Muscles and Tendons

Intact.

Soft tissues

No soft tissue mass or fluid collection.
IMPRESSION: 1. No acute osseous abnormality. Healed second MCP joint sesamoid
fracture.
